# Patient Record
Sex: Male | Born: 1970 | Hispanic: No | Smoking: Current every day smoker
Health system: Southern US, Community
[De-identification: ages and names within clinical notes are randomized; demographics above are authoritative.]

## PROBLEM LIST (undated history)

## (undated) DIAGNOSIS — K219 Gastro-esophageal reflux disease without esophagitis: Secondary | ICD-10-CM

## (undated) DIAGNOSIS — E119 Type 2 diabetes mellitus without complications: Secondary | ICD-10-CM

## (undated) HISTORY — DX: Type 2 diabetes mellitus without complications: E11.9

## (undated) HISTORY — DX: Gastro-esophageal reflux disease without esophagitis: K21.9

---

## 2015-02-04 ENCOUNTER — Ambulatory Visit (INDEPENDENT_AMBULATORY_CARE_PROVIDER_SITE_OTHER): Payer: Self-pay | Admitting: Physician Assistant

## 2015-02-04 VITALS — BP 132/80 | HR 102 | Temp 98.6°F | Resp 16 | Ht 67.0 in | Wt 193.0 lb

## 2015-02-04 DIAGNOSIS — G629 Polyneuropathy, unspecified: Secondary | ICD-10-CM

## 2015-02-04 DIAGNOSIS — E1142 Type 2 diabetes mellitus with diabetic polyneuropathy: Secondary | ICD-10-CM

## 2015-02-04 LAB — COMPREHENSIVE METABOLIC PANEL
ALK PHOS: 71 U/L (ref 40–115)
ALT: 13 U/L (ref 9–46)
AST: 11 U/L (ref 10–40)
Albumin: 4.3 g/dL (ref 3.6–5.1)
BILIRUBIN TOTAL: 0.4 mg/dL (ref 0.2–1.2)
BUN: 12 mg/dL (ref 7–25)
CO2: 26 mmol/L (ref 20–31)
Calcium: 9 mg/dL (ref 8.6–10.3)
Chloride: 97 mmol/L — ABNORMAL LOW (ref 98–110)
Creat: 0.69 mg/dL (ref 0.60–1.35)
GLUCOSE: 343 mg/dL — AB (ref 65–99)
POTASSIUM: 4 mmol/L (ref 3.5–5.3)
Sodium: 132 mmol/L — ABNORMAL LOW (ref 135–146)
Total Protein: 6.8 g/dL (ref 6.1–8.1)

## 2015-02-04 LAB — POC MICROSCOPIC URINALYSIS (UMFC): MUCUS RE: ABSENT

## 2015-02-04 LAB — POCT URINALYSIS DIP (MANUAL ENTRY)
BILIRUBIN UA: NEGATIVE
Bilirubin, UA: NEGATIVE
Blood, UA: NEGATIVE
Leukocytes, UA: NEGATIVE
Nitrite, UA: NEGATIVE
Protein Ur, POC: NEGATIVE
SPEC GRAV UA: 1.015
Urobilinogen, UA: 0.2
pH, UA: 6

## 2015-02-04 LAB — POCT GLYCOSYLATED HEMOGLOBIN (HGB A1C): Hemoglobin A1C: >14

## 2015-02-04 LAB — GLUCOSE, POCT (MANUAL RESULT ENTRY): POC Glucose: 344 mg/dl — AB (ref 70–99)

## 2015-02-04 MED ORDER — GABAPENTIN 100 MG PO CAPS: 100.0000 mg | ORAL_CAPSULE | Freq: Three times a day (TID) | ORAL | Status: DC

## 2015-02-04 MED ORDER — METFORMIN HCL 1000 MG PO TABS: 1000.0000 mg | ORAL_TABLET | Freq: Two times a day (BID) | ORAL | Status: DC

## 2015-02-04 NOTE — Patient Instructions (Signed)
Diabetes Mellitus and Food It is important for you to manage your blood sugar (glucose) level. Your blood glucose level can be greatly affected by what you eat. Eating healthier foods in the appropriate amounts throughout the day at about the same time each day will help you control your blood glucose level. It can also help slow or prevent worsening of your diabetes mellitus. Healthy eating may even help you improve the level of your blood pressure and reach or maintain a healthy weight.  General recommendations for healthful eating and cooking habits include:  Eating meals and snacks regularly. Avoid going long periods of time without eating to lose weight.  Eating a diet that consists mainly of plant-based foods, such as fruits, vegetables, nuts, legumes, and whole grains.  Using low-heat cooking methods, such as baking, instead of high-heat cooking methods, such as deep frying. Work with your dietitian to make sure you understand how to use the Nutrition Facts information on food labels. HOW CAN FOOD AFFECT ME? Carbohydrates Carbohydrates affect your blood glucose level more than any other type of food. Your dietitian will help you determine how many carbohydrates to eat at each meal and teach you how to count carbohydrates. Counting carbohydrates is important to keep your blood glucose at a healthy level, especially if you are using insulin or taking certain medicines for diabetes mellitus. Alcohol Alcohol can cause sudden decreases in blood glucose (hypoglycemia), especially if you use insulin or take certain medicines for diabetes mellitus. Hypoglycemia can be a life-threatening condition. Symptoms of hypoglycemia (sleepiness, dizziness, and disorientation) are similar to symptoms of having too much alcohol.  If your health care provider has given you approval to drink alcohol, do so in moderation and use the following guidelines:  Women should not have more than one drink per day, and men  should not have more than two drinks per day. One drink is equal to:  12 oz of beer.  5 oz of wine.  1 oz of hard liquor.  Do not drink on an empty stomach.  Keep yourself hydrated. Have water, diet soda, or unsweetened iced tea.  Regular soda, juice, and other mixers might contain a lot of carbohydrates and should be counted. WHAT FOODS ARE NOT RECOMMENDED? As you make food choices, it is important to remember that all foods are not the same. Some foods have fewer nutrients per serving than other foods, even though they might have the same number of calories or carbohydrates. It is difficult to get your body what it needs when you eat foods with fewer nutrients. Examples of foods that you should avoid that are high in calories and carbohydrates but low in nutrients include:  Trans fats (most processed foods list trans fats on the Nutrition Facts label).  Regular soda.  Juice.  Candy.  Sweets, such as cake, pie, doughnuts, and cookies.  Fried foods. WHAT FOODS CAN I EAT? Eat nutrient-rich foods, which will nourish your body and keep you healthy. The food you should eat also will depend on several factors, including:  The calories you need.  The medicines you take.  Your weight.  Your blood glucose level.  Your blood pressure level.  Your cholesterol level. You should eat a variety of foods, including:  Protein.  Lean cuts of meat.  Proteins low in saturated fats, such as fish, egg whites, and beans. Avoid processed meats.  Fruits and vegetables.  Fruits and vegetables that may help control blood glucose levels, such as apples, mangoes, and   yams.  Dairy products.  Choose fat-free or low-fat dairy products, such as milk, yogurt, and cheese.  Grains, bread, pasta, and rice.  Choose whole grain products, such as multigrain bread, whole oats, and brown rice. These foods may help control blood pressure.  Fats.  Foods containing healthful fats, such as nuts,  avocado, olive oil, canola oil, and fish. DOES EVERYONE WITH DIABETES MELLITUS HAVE THE SAME MEAL PLAN? Because every person with diabetes mellitus is different, there is not one meal plan that works for everyone. It is very important that you meet with a dietitian who will help you create a meal plan that is just right for you.   This information is not intended to replace advice given to you by your health care provider. Make sure you discuss any questions you have with your health care provider.   Document Released: 01/13/2005 Document Revised: 05/09/2014 Document Reviewed: 03/15/2013 Elsevier Interactive Patient Education 2016 [employer]  

## 2015-02-04 NOTE — Progress Notes (Signed)
Subjective:    Patient ID: Kirk Foley, male    DOB: December 10, 1970, 44 y.o.   MRN: [id number]  HPI Patient presents for bilateral burning/tingling foot pain that has been getting progressively worse over the past 3-4 months. Pain mostly felt when he is walking or standing and only has sensation on the bottom of his feet. Pain does not radiate. Denies numbness or weakness. No fever or trauma noted. H/o prediabetes that was treated for 3 times "a long time ago" and discontinued medication bc he didn't think he needed when he decreased the amount of meat he was eating. Denies dyslipidemia and HTN. Not taking following any particular diet and does not exercise. 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 NKDA.    Review of Systems  Constitutional: Negative for fever, chills and fatigue.  Respiratory: Negative for shortness of breath.   Cardiovascular: Negative for chest pain, palpitations and leg swelling.  Gastrointestinal: Negative for nausea.  Endocrine: Negative for polydipsia, polyphagia and polyuria.  Neurological: Negative for dizziness, weakness, numbness and headaches.       Objective:   Physical Exam  Constitutional: He is oriented to person, place, and time. He appears well-developed and well-nourished. No distress.  Blood pressure 132/80, pulse 102, temperature 98.6 F (37 C), temperature source Oral, resp. rate 16, height  (1.702 m), weight 193 lb (87.544 kg), SpO2 98 %.   HENT:  Head: Normocephalic and atraumatic.  Right Ear: External ear normal.  Left Ear: External ear normal.  Eyes: Conjunctivae are normal. Pupils are equal, round, and reactive to light. Right eye exhibits no discharge. Left eye exhibits no discharge. No scleral icterus.  Neck: Normal range of motion. Neck supple. No thyromegaly present.  Cardiovascular: Normal rate, regular rhythm, normal heart sounds and intact distal pulses.  Exam reveals no gallop and no friction  rub.   No murmur heard. Pulmonary/Chest: Effort normal and breath sounds normal. No respiratory distress. He has no wheezes. He has no rales.  Musculoskeletal: Normal range of motion. He exhibits no edema or tenderness.  Lymphadenopathy:    He has no cervical adenopathy.  Neurological: He is alert and oriented to person, place, and time. He has normal strength and normal reflexes. He displays no atrophy. No cranial nerve deficit or sensory deficit. He exhibits normal muscle tone. Coordination normal.  2 point discrimination intact  Skin: Skin is warm and dry. No rash noted. He is not diaphoretic. No erythema. No pallor.  Psychiatric: He has a normal mood and affect. His behavior is normal. Judgment and thought content normal.     Results for orders placed or performed in visit on 02/04/15  POCT glucose (manual entry)  Result Value Ref Range   POC Glucose 344 (A) 70 - 99 mg/dl  POCT glycosylated hemoglobin (Hb A1C)  Result Value Ref Range   Hemoglobin A1C >14   POCT urinalysis dipstick  Result Value Ref Range   Color, UA yellow yellow   Clarity, UA clear clear   Glucose, UA >=1,000 (A) negative   Bilirubin, UA negative negative   Ketones, POC UA negative negative   Spec Grav, UA 1.015    Blood, UA negative negative   pH, UA 6.0    Protein Ur, POC negative negative   Urobilinogen, UA 0.2    Nitrite, UA Negative Negative   Leukocytes, UA Negative Negative  POCT Microscopic Urinalysis ([facility])  Result Value Ref Range   WBC,UR,HPF,POC  Few (A) None WBC/hpf   RBC,UR,HPF,POC Few (A) None RBC/hpf   Bacteria Few (A) None   Mucus Absent Absent   Epithelial Cells, UR Per Microscopy Few (A) None cells/hpf       Assessment & Plan:  1. Type 2 diabetes mellitus with diabetic polyneuropathy, without long-term current use of insulin (HCC) Discussed diet and exercise at length. Will rtc in 2 weeks and will start second medication at that time. Will re-check glucose at that time as well. -  POCT urinalysis dipstick - POCT Microscopic Urinalysis ([facility]) - Comprehensive metabolic panel - metFORMIN (GLUCOPHAGE) 1000 MG tablet; Take 1 tablet (1,000 mg total) by mouth 2 (two) times daily with a meal.  Dispense: 180 tablet; Refill: 0  2. Neuropathy (HCC) - POCT glucose (manual entry) - POCT glycosylated hemoglobin (Hb A1C) - gabapentin (NEURONTIN) 100 MG capsule; Take 1 capsule (100 mg total) by mouth 3 (three) times daily.  Dispense: 90 capsule; Refill: 3   Kirk Blanton PA-C  [facility] 02/05/2015 8:59 AM

## 2015-02-05 ENCOUNTER — Encounter: Payer: Self-pay | Admitting: Physician Assistant

## 2015-02-05 ENCOUNTER — Telehealth: Payer: Self-pay | Admitting: Physician Assistant

## 2015-02-05 DIAGNOSIS — G629 Polyneuropathy, unspecified: Secondary | ICD-10-CM | POA: Insufficient documentation

## 2015-02-05 DIAGNOSIS — E1142 Type 2 diabetes mellitus with diabetic polyneuropathy: Secondary | ICD-10-CM | POA: Insufficient documentation

## 2015-02-05 NOTE — Telephone Encounter (Signed)
Relayed labs. States doing well with metformin without any side effects. Foot pain better as well and took gabapentin last night. Will rtc for f/u in 2 weeks. States that he feels much better than he did previous.

## 2015-02-18 ENCOUNTER — Ambulatory Visit (INDEPENDENT_AMBULATORY_CARE_PROVIDER_SITE_OTHER): Payer: Self-pay | Admitting: Urgent Care

## 2015-02-18 VITALS — BP 144/80 | HR 98 | Temp 98.7°F | Resp 16 | Ht 68.0 in | Wt 196.0 lb

## 2015-02-18 DIAGNOSIS — IMO0001 Reserved for inherently not codable concepts without codable children: Secondary | ICD-10-CM

## 2015-02-18 DIAGNOSIS — G629 Polyneuropathy, unspecified: Secondary | ICD-10-CM

## 2015-02-18 DIAGNOSIS — R030 Elevated blood-pressure reading, without diagnosis of hypertension: Secondary | ICD-10-CM

## 2015-02-18 DIAGNOSIS — E119 Type 2 diabetes mellitus without complications: Secondary | ICD-10-CM | POA: Insufficient documentation

## 2015-02-18 LAB — HEMOGLOBIN A1C: Hgb A1c MFr Bld: 12.1 % — AB (ref 4.0–6.0)

## 2015-02-18 LAB — POCT GLYCOSYLATED HEMOGLOBIN (HGB A1C): Hemoglobin A1C: 12.1

## 2015-02-18 LAB — GLUCOSE, POCT (MANUAL RESULT ENTRY): POC GLUCOSE: 155 mg/dL — AB (ref 70–99)

## 2015-02-18 NOTE — Progress Notes (Signed)
    MRN: [id number] DOB: 10/29/1970  Subjective:   Kirk Foley is a 44 y.o. male presenting for chief complaint of Follow-up and OTHER  Reports new diagnosis of diabetes on 02/04/2015. He has since started Metformin, also started Gabapentin for diabetic neuropathy. He has not noticed a difference in his neuropathy with gabapentin, is taking 100mg  once daily. Denies chest pain, shob, n/v, abdominal pain, hematuria, headache, double vision, blurred vision. Patient is working on quitting smoking. Currently smoking 1 ppd every 2 days down from 1 pack every day. Denies any other aggravating or relieving factors, no other questions or concerns.  Kirk Foley has a current medication list which includes the following prescription(s): gabapentin and metformin. Also has No Known Allergies.  Kirk Foley  has a past medical history of GERD (gastroesophageal reflux disease) and Diabetes mellitus without complication (HCC). Also  has no past surgical history on file.  Objective:   Vitals: BP 144/80 mmHg  Pulse 98  Temp(Src) 98.7 F (37.1 C) (Oral)  Resp 16  Ht 5\' 8"  (1.727 m)  Wt 196 lb (88.905 kg)  BMI 29.81 kg/m2  SpO2 98%  BP Readings from Last 3 Encounters:  02/18/15 144/80  02/04/15 132/80   Physical Exam  Constitutional: He is oriented to person, place, and time. He appears well-developed and well-nourished.  HENT:  Mouth/Throat: Oropharynx is clear and moist.  Eyes: Right eye exhibits no discharge. Left eye exhibits no discharge. No scleral icterus.  Cardiovascular: Normal rate, regular rhythm and intact distal pulses.  Exam reveals no gallop and no friction rub.   No murmur heard. Pulmonary/Chest: No respiratory distress. He has no wheezes. He has no rales.  Abdominal: Soft. Bowel sounds are normal. He exhibits no distension and no mass. There is no tenderness.  Musculoskeletal: He exhibits no edema or tenderness.  Neurological: He is alert and oriented to person, place, and time.  Skin:  Skin is warm and dry. No rash noted. No erythema. No pallor.  Patient has multiple resolving skin boils over his upper back.   Results for orders placed or performed in visit on 02/18/15 (from the past 24 hour(s))  POCT glucose (manual entry)     Status: Abnormal   Collection Time: 02/18/15  5:56 PM  Result Value Ref Range   POC Glucose 155 (A) 70 - 99 mg/dl  POCT glycosylated hemoglobin (Hb A1C)     Status: None   Collection Time: 02/18/15  5:56 PM  Result Value Ref Range   Hemoglobin A1C 12.1    Assessment and Plan :   1. New onset type 2 diabetes mellitus (HCC) - Patient refused medical care. States that he only came to get his blood sugar checked again. He refuses adding additional medications that I recommended today including glipizide and lisinopril. I counseled patient on risks of uncontrolled diabetes, importance of managing this, patient verbalized understanding and refused medical care.  2. Neuropathy (HCC) - Recommended patient increase his Gabapentin to 300mg  2-3 times daily for his diabetic neuropathy.  3. Elevated blood pressure - Patient refused lisinopril.  Wallis BambergMario Season Astacio, PA-C [facility] [phone] 02/18/2015 5:20 PM

## 2015-02-18 NOTE — Patient Instructions (Signed)
Type 2 Diabetes Mellitus, Adult Type 2 diabetes mellitus, often simply referred to as type 2 diabetes, is a long-lasting (chronic) disease. In type 2 diabetes, the pancreas does not make enough insulin (a hormone), the cells are less responsive to the insulin that is made (insulin resistance), or both. Normally, insulin moves sugars from food into the tissue cells. The tissue cells use the sugars for energy. The lack of insulin or the lack of normal response to insulin causes excess sugars to build up in the blood instead of going into the tissue cells. As a result, high blood sugar (hyperglycemia) develops. The effect of high sugar (glucose) levels can cause many complications. Type 2 diabetes was also previously called adult-onset diabetes, but it can occur at any age.  RISK FACTORS  A person is predisposed to developing type 2 diabetes if someone in the family has the disease and also has one or more of the following primary risk factors:  Weight gain, or being overweight or obese.  An inactive lifestyle.  A history of consistently eating high-calorie foods. Maintaining a normal weight and regular physical activity can reduce the chance of developing type 2 diabetes. SYMPTOMS  A person with type 2 diabetes may not show symptoms initially. The symptoms of type 2 diabetes appear slowly. The symptoms include:  Increased thirst (polydipsia).  Increased urination (polyuria).  Increased urination during the night (nocturia).  Sudden or unexplained weight changes.  Frequent, recurring infections.  Tiredness (fatigue).  Weakness.  Vision changes, such as blurred vision.  Fruity smell to your breath.  Abdominal pain.  Nausea or vomiting.  Cuts or bruises which are slow to heal.  Tingling or numbness in the hands or feet.  An open skin wound (ulcer). DIAGNOSIS Type 2 diabetes is frequently not diagnosed until complications of diabetes are present. Type 2 diabetes is diagnosed  when symptoms or complications are present and when blood glucose levels are increased. Your blood glucose level may be checked by one or more of the following blood tests:  A fasting blood glucose test. You will not be allowed to eat for at least 8 hours before a blood sample is taken.  A random blood glucose test. Your blood glucose is checked at any time of the day regardless of when you ate.  A hemoglobin A1c blood glucose test. A hemoglobin A1c test provides information about blood glucose control over the previous 3 months.  An oral glucose tolerance test (OGTT). Your blood glucose is measured after you have not eaten (fasted) for 2 hours and then after you drink a glucose-containing beverage. TREATMENT   You may need to take insulin or diabetes medicine daily to keep blood glucose levels in the desired range.  If you use insulin, you may need to adjust the dosage depending on the carbohydrates that you eat with each meal or snack.  Lifestyle changes are recommended as part of your treatment. These may include:  Following an individualized diet plan developed by a nutritionist or dietitian.  Exercising daily. Your health care providers will set individualized treatment goals for you based on your age, your medicines, how long you have had diabetes, and any other medical conditions you have. Generally, the goal of treatment is to maintain the following blood glucose levels:  Before meals (preprandial): 80-130 mg/dL.  After meals (postprandial): below 180 mg/dL.  A1c: less than 6.5-7%. HOME CARE INSTRUCTIONS   Have your hemoglobin A1c level checked twice a year.  Perform daily blood glucose monitoring  as directed by your health care provider.  Monitor urine ketones when you are ill and as directed by your health care provider.  Take your diabetes medicine or insulin as directed by your health care provider to maintain your blood glucose levels in the desired range.  Never run  out of diabetes medicine or insulin. It is needed every day.  If you are using insulin, you may need to adjust the amount of insulin given based on your intake of carbohydrates. Carbohydrates can raise blood glucose levels but need to be included in your diet. Carbohydrates provide vitamins, minerals, and fiber which are an essential part of a healthy diet. Carbohydrates are found in fruits, vegetables, whole grains, dairy products, legumes, and foods containing added sugars.  Eat healthy foods. You should make an appointment to see a registered dietitian to help you create an eating plan that is right for you.  Lose weight if you are overweight.  Carry a medical alert card or wear your medical alert jewelry.  Carry a 15-gram carbohydrate snack with you at all times to treat low blood glucose (hypoglycemia). Some examples of 15-gram carbohydrate snacks include:  Glucose tablets, 3 or 4.  Glucose gel, 15-gram tube.  Raisins, 2 tablespoons (24 grams).  Jelly beans, 6.  Animal crackers, 8.  Regular pop, 4 ounces (120 mL).  Gummy treats, 9.  Recognize hypoglycemia. Hypoglycemia occurs with blood glucose levels of 70 mg/dL and below. The risk for hypoglycemia increases when fasting or skipping meals, during or after intense exercise, and during sleep. Hypoglycemia symptoms can include:  Tremors or shakes.  Decreased ability to concentrate.  Sweating.  Increased heart rate.  Headache.  Dry mouth.  Hunger.  Irritability.  Anxiety.  Restless sleep.  Altered speech or coordination.  Confusion.  Treat hypoglycemia promptly. If you are alert and able to safely swallow, follow the 15:15 rule:  Take 15-20 grams of rapid-acting glucose or carbohydrate. Rapid-acting options include glucose gel, glucose tablets, or 4 ounces (120 mL) of fruit juice, regular soda, or low-fat milk.  Check your blood glucose level 15 minutes after taking the glucose.  Take 15-20 grams more of  glucose if the repeat blood glucose level is still 70 mg/dL or below.  Eat a meal or snack within 1 hour once blood glucose levels return to normal.  Be alert to feeling very thirsty and urinating more frequently than usual, which are early signs of hyperglycemia. An early awareness of hyperglycemia allows for prompt treatment. Treat hyperglycemia as directed by your health care provider.  Engage in at least 150 minutes of moderate-intensity physical activity a week, spread over at least 3 days of the week or as directed by your health care provider. In addition, you should engage in resistance exercise at least 2 times a week or as directed by your health care provider. Try to spend no more than 90 minutes at one time inactive.  Adjust your medicine and food intake as needed if you start a new exercise or sport.  Follow your sick-day plan anytime you are unable to eat or drink as usual.  Do not use any tobacco products including cigarettes, chewing tobacco, or electronic cigarettes. If you need help quitting, ask your health care provider.  Limit alcohol intake to no more than 1 drink per day for nonpregnant women and 2 drinks per day for men. You should drink alcohol only when you are also eating food. Talk with your health care provider whether alcohol is safe   for you. Tell your health care provider if you drink alcohol several times a week.  Keep all follow-up visits as directed by your health care provider. This is important.  Schedule an eye exam soon after the diagnosis of type 2 diabetes and then annually.  Perform daily skin and foot care. Examine your skin and feet daily for cuts, bruises, redness, nail problems, bleeding, blisters, or sores. A foot exam by a health care provider should be done annually.  Brush your teeth and gums at least twice a day and floss at least once a day. Follow up with your dentist regularly.  Share your diabetes management plan with your workplace or  school.  Keep your immunizations up to date. It is recommended that you receive a flu (influenza) vaccine every year. It is also recommended that you receive a pneumonia (pneumococcal) vaccine. If you are 41 years of age or older and have never received a pneumonia vaccine, this vaccine may be given as a series of two separate shots. Ask your health care provider which additional vaccines may be recommended.  Learn to manage stress.  Obtain ongoing diabetes education and support as needed.  Participate in or seek rehabilitation as needed to maintain or improve independence and quality of life. Request a physical or occupational therapy referral if you are having foot or hand numbness, or difficulties with grooming, dressing, eating, or physical activity. SEEK MEDICAL CARE IF:   You are unable to eat food or drink fluids for more than 6 hours.  You have nausea and vomiting for more than 6 hours.  Your blood glucose level is over 240 mg/dL.  There is a change in mental status.  You develop an additional serious illness.  You have diarrhea for more than 6 hours.  You have been sick or have had a fever for a couple of days and are not getting better.  You have pain during any physical activity.  SEEK IMMEDIATE MEDICAL CARE IF:  You have difficulty breathing.  You have moderate to large ketone levels.   This information is not intended to replace advice given to you by your health care provider. Make sure you discuss any questions you have with your health care provider.   Document Released: 04/18/2005 Document Revised: 01/07/2015 Document Reviewed: 11/15/2011 Elsevier Interactive Patient Education 2016 [employer].   Diabetic Neuropathy Diabetic neuropathy is a nerve disease or nerve damage that is caused by diabetes mellitus. About half of all people with diabetes mellitus have some form of nerve damage. Nerve damage is more common in those who have had diabetes mellitus for  many years and who generally have not had good control of their blood sugar (glucose) level. Diabetic neuropathy is a common complication of diabetes mellitus. There are three common types of diabetic neuropathy and a fourth type that is less common and less understood:   Peripheral neuropathy--This is the most common type of diabetic neuropathy. It causes damage to the nerves of the feet and legs first and then eventually the hands and arms.The damage affects the ability to sense touch.  Autonomic neuropathy--This type causes damage to the autonomic nervous system, which controls the following functions:  Heartbeat.  Body temperature.  Blood pressure.  Urination.  Digestion.  Sweating.  Sexual function.  Focal neuropathy--Focal neuropathy can be painful and unpredictable and occurs most often in older adults with diabetes mellitus. It involves a specific nerve or one area and often comes on suddenly. It usually does not cause  long-term problems.  Radiculoplexus neuropathy-- Sometimes called lumbosacral radiculoplexus neuropathy, radiculoplexus neuropathy affects the nerves of the thighs, hips, buttocks, or legs. It is more common in people with type 2 diabetes mellitus and in older men. It is characterized by debilitating pain, weakness, and atrophy, usually in the thigh muscles. CAUSES  The cause of peripheral, autonomic, and focal neuropathies is diabetes mellitus that is uncontrolled and high glucose levels. The cause of radiculoplexus neuropathy is unknown. However, it is thought to be caused by inflammation related to uncontrolled glucose levels. SIGNS AND SYMPTOMS  Peripheral Neuropathy Peripheral neuropathy develops slowly over time. When the nerves of the feet and legs no longer work there may be:   Burning, stabbing, or aching pain in the legs or feet.  Inability to feel pressure or pain in your feet. This can lead to:  Thick calluses over pressure areas.  Pressure  sores.  Ulcers.  Foot deformities.  Reduced ability to feel temperature changes.  Muscle weakness. Autonomic Neuropathy The symptoms of autonomic neuropathy vary depending on which nerves are affected. Symptoms may include:  Problems with digestion, such as:  Feeling sick to your stomach (nausea).  Vomiting.  Bloating.  Constipation.  Diarrhea.  Abdominal pain.  Difficulty with urination. This occurs if you lose your ability to sense when your bladder is full. Problems include:  Urine leakage (incontinence).  Inability to empty your bladder completely (retention).  Rapid or irregular heartbeat (palpitations).  Blood pressure drops when you stand up (orthostatic hypotension). When you stand up you may feel:  Dizzy.  Weak.  Faint.  In men, inability to attain and maintain an erection.  In women, vaginal dryness and problems with decreased sexual desire and arousal.  Problems with body temperature regulation.  Increased or decreased sweating. Focal Neuropathy  Abnormal eye movements or abnormal alignment of both eyes.  Weakness in the wrist.  Foot drop. This results in an inability to lift the foot properly and abnormal walking or foot movement.  Paralysis on one side of your face (Bell palsy).  Chest or abdominal pain. Radiculoplexus Neuropathy  Sudden, severe pain in your hip, thigh, or buttocks.  Weakness and wasting of thigh muscles.  Difficulty rising from a seated position.  Abdominal swelling.  Unexplained weight loss (usually more than 10 lb [4.5 kg]). DIAGNOSIS  Peripheral Neuropathy Your senses may be tested. Sensory function testing can be done with:  A light touch using a monofilament.  A vibration with tuning fork.  A sharp sensation with a pin prick. Other tests that can help diagnose neuropathy are:  Nerve conduction velocity. This test checks the transmission of an electrical current through a nerve.  Electromyography.  This shows how muscles respond to electrical signals transmitted by nearby nerves.  Quantitative sensory testing. This is used to assess how your nerves respond to vibrations and changes in temperature. Autonomic Neuropathy Diagnosis is often based on reported symptoms. Tell your health care provider if you experience:   Dizziness.   Constipation.   Diarrhea.   Inappropriate urination or inability to urinate.   Inability to get or maintain an erection.  Tests that may be done include:   Electrocardiography or Holter monitor. These are tests that can help show problems with the heart rate or heart rhythm.   An X-ray exam may be done. Focal Neuropathy Diagnosis is made based on your symptoms and what your health care provider finds during your exam. Other tests may be done. They may include:  Nerve conduction  velocities. This checks the transmission of electrical current through a nerve.  Electromyography. This shows how muscles respond to electrical signals transmitted by nearby nerves.  Quantitative sensory testing. This test is used to assess how your nerves respond to vibration and changes in temperature. Radiculoplexus Neuropathy  Often the first thing is to eliminate any other issue or problems that might be the cause, as there is no stick test for diagnosis.  X-ray exam of your spine and lumbar region.  Spinal tap to rule out cancer.  MRI to rule out other lesions. TREATMENT  Once nerve damage occurs, it cannot be reversed. The goal of treatment is to keep the disease or nerve damage from getting worse and affecting more nerve fibers. Controlling your blood glucose level is the key. Most people with radiculoplexus neuropathy see at least a partial improvement over time. You will need to keep your blood glucose and HbA1c levels in the target range determined by your health care provider. Things that help control blood glucose levels include:   Blood glucose  monitoring.   Meal planning.   Physical activity.   Diabetes medicine.  Over time, maintaining lower blood glucose levels helps lessen symptoms. Sometimes, prescription pain medicine is needed. HOME CARE INSTRUCTIONS:  Do not smoke.  Keep your blood glucose level in the range that you and your health care provider have determined acceptable for you.  Keep your blood pressure level in the range that you and your health care provider have determined acceptable for you.  Eat a well-balanced diet.  Be physically active every day. Include strength training and balance exercises.  Protect your feet.  Check your feet every day for sores, cuts, blisters, or signs of infection.  Wear padded socks and supportive shoes. Use orthotic inserts, if necessary.  Regularly check the insides of your shoes for worn spots. Make sure there are no rocks or other items inside your shoes before you put them on. SEEK MEDICAL CARE IF:   You have burning, stabbing, or aching pain in the legs or feet.  You are unable to feel pressure or pain in your feet.  You develop problems with digestion such as:  Nausea.  Vomiting.  Bloating.  Constipation.  Diarrhea.  Abdominal pain.  You have difficulty with urination, such as:  Incontinence.  Retention.  You have palpitations.  You develop orthostatic hypotension. When you stand up you may feel:  Dizzy.  Weak.  Faint.  You cannot attain and maintain an erection (in men).  You have vaginal dryness and problems with decreased sexual desire and arousal (in women).  You have severe pain in your thighs, legs, or buttocks.  You have unexplained weight loss.   This information is not intended to replace advice given to you by your health care provider. Make sure you discuss any questions you have with your health care provider.   Document Released: 06/27/2001 Document Revised: 05/09/2014 Document Reviewed: 09/27/2012 Elsevier  Interactive Patient Education [employer]

## 2015-02-27 ENCOUNTER — Encounter: Payer: Self-pay | Admitting: Family Medicine

## 2015-03-02 ENCOUNTER — Encounter: Payer: Self-pay | Admitting: Family Medicine

## 2015-08-25 ENCOUNTER — Other Ambulatory Visit: Payer: Self-pay | Admitting: Urgent Care

## 2015-08-25 ENCOUNTER — Other Ambulatory Visit: Payer: Self-pay

## 2015-08-25 DIAGNOSIS — G629 Polyneuropathy, unspecified: Secondary | ICD-10-CM

## 2015-08-25 MED ORDER — GABAPENTIN 100 MG PO CAPS: 100.0000 mg | ORAL_CAPSULE | Freq: Three times a day (TID) | ORAL | Status: DC

## 2016-09-29 ENCOUNTER — Ambulatory Visit (INDEPENDENT_AMBULATORY_CARE_PROVIDER_SITE_OTHER): Payer: Self-pay | Admitting: Family Medicine

## 2016-09-29 ENCOUNTER — Encounter: Payer: Self-pay | Admitting: Family Medicine

## 2016-09-29 VITALS — BP 126/79 | HR 87 | Temp 98.6°F | Resp 18 | Ht 67.52 in | Wt 187.4 lb

## 2016-09-29 DIAGNOSIS — E119 Type 2 diabetes mellitus without complications: Secondary | ICD-10-CM

## 2016-09-29 DIAGNOSIS — M7661 Achilles tendinitis, right leg: Secondary | ICD-10-CM | POA: Insufficient documentation

## 2016-09-29 DIAGNOSIS — M7662 Achilles tendinitis, left leg: Secondary | ICD-10-CM

## 2016-09-29 MED ORDER — METFORMIN HCL 500 MG PO TABS: 500.0000 mg | ORAL_TABLET | Freq: Two times a day (BID) | ORAL | 3 refills | Status: AC

## 2016-09-29 MED ORDER — DICLOFENAC SODIUM 75 MG PO TBEC: 75.0000 mg | DELAYED_RELEASE_TABLET | Freq: Two times a day (BID) | ORAL | 0 refills | Status: AC

## 2016-09-29 NOTE — Patient Instructions (Addendum)
[email or web address]://choosehealth.[place].gov/documents/pdfs/multicultural/arabic.pdf  Go to the website diabetes.org and look up type 2 diabetes and read as much about it as you can.  Taking care of your diabetes is very important. Read the handout that I gave you very carefully. I realize you do not have insurance and health care is expensive, but it would be a good idea for you to see a doctor a couple of times a year to get some laboratory tests done and monitor your diabetes.  Continue to avoid eating a lot of things that have sugar or are sweet.  Avoid too much bread and pasta and rice and potatoes and grain.  It is better to eat things like beans and fruits and vegetables and lean meats. I realize this is difficult with the [place] diet.   Check your blood sugars a couple of times a week and write them down. The best time to check them is in the morning before you have had anything to eat. If you are running many blood sugars above 125 when you are fasting and have not had anything to eat for 6 or 8 hours, then I think you should come see a doctor to get started on some medicine.    You have Achilles tendinitis. That is why your heels hurt.  Apply ice for about 15 minutes 2-4 times each day  Take diclofenac 75 mg one twice daily for pain and inflammation  Do gentle stretches of the heel several times a day. However also you should avoid jumping or running hard.  If you continue to have a lot of pain we may need to refer you to a sports medicine specialist who deal with a lot of this kind of problem or to a physical therapist.      IF you received an x-ray today, you will receive an invoice from [facility]. Please contact [facility] at [phone] with questions or concerns regarding your invoice.   IF you received labwork today, you will receive an invoice from [facility]. Please contact [facility] at [phone] with questions or concerns regarding your invoice.   Our billing  staff will not be able to assist you with questions regarding bills from these companies.  You will be contacted with the lab results as soon as they are available. The fastest way to get your results is to activate your My Chart account. Instructions are located on the last page of this paperwork. If you have not heard from us regarding the results in 2 weeks, please contact this office.

## 2016-09-29 NOTE — Progress Notes (Signed)
Patient ID: Kirk DrownRachid Foley, male    DOB: 07/07/1970  Age: 46 y.o. MRN: [id number]  Chief Complaint  Patient presents with  . Ankle Pain    both ankles down to the foot x 1 week     Subjective:   Patient is here because he has been hurting for the last couple of weeks of both heels. No known injury. He stands for a long time at work, and that makes him hurt terribly. He has had symptoms which sound like plantar fasciitis in the past, but that is not what is bothering him now.  He is a diabetic and is not on any medication. 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 Now he is more active on his feet all day, but does not do a lot of physical exercise other than the prolonged standing.  He is from [place], speaks good English, does not have insurance.  Current allergies, medications, problem list, past/family and social histories reviewed.  Objective:  BP 126/79   Pulse 87   Temp 98.6 F (37 C) (Oral)   Resp 18   Ht 5' 7.52" (1.715 m)   Wt 187 lb 6.4 oz (85 kg)   SpO2 98%   BMI 28.90 kg/m   Pleasant gentleman alert and oriented. TMs normal. Throat clear. Neck supple without nodes or thyromegaly. Chest clear. Heart regular without murmurs. Abdomen unremarkable. Sensory of his toes with filament was normal. Ankles show very tender but not red or hot Achilles tendons bilaterally.   Assessment & Plan:   Assessment: 1. Achilles tendonitis, bilateral   2. Type 2 diabetes mellitus without complication, without long-term current use of insulin (HCC)       Plan: See instructions. Long discussion.  I gave him a sample free glucose meter and we tested it in the office he was 140 today. He is to keep a record of his sugars and bring them back in with him.   Patient Instructions   AdvertisingReporter.co.nzHttp://choosehealth.[place].gov/documents/pdfs/multicultural/arabic.pdf  Go to the website diabetes.org and look up type 2  diabetes and read as much about it as you can.  Taking care of your diabetes is very important. Read the handout that I gave you very carefully. I realize you do not have insurance and health care is expensive, but it would be a good idea for you to see a doctor a couple of times a year to get some laboratory tests done and monitor your diabetes.  Continue to avoid eating a lot of things that have sugar or are sweet.  Avoid too much bread and pasta and rice and potatoes and grain.  It is better to eat things like beans and fruits and vegetables and lean meats. I realize this is difficult with the [place] diet.   Check your blood sugars a couple of times a week and write them down. The best time to check them is in the morning before you have had anything to eat. If you are running many blood sugars above 125 when you are fasting and have not had anything to eat for 6 or 8 hours, then I think you should come see a doctor to get started on some medicine.    You have Achilles tendinitis. That is why your heels hurt.  Apply ice for about 15 minutes 2-4 times each day  Take diclofenac 75 mg one twice daily for pain and inflammation  Do gentle stretches of the heel several times a day. However also you should avoid jumping or running hard.  If you continue to have a lot of pain we may need to refer you to a sports medicine specialist who deal with a lot of this kind of problem or to a physical therapist.      IF you received an x-ray today, you will receive an invoice from [facility]. Please contact [facility] at [phone] with questions or concerns regarding your invoice.   IF you received labwork today, you will receive an invoice from [facility]. Please contact [facility] at [phone] with questions or concerns regarding your invoice.   Our billing staff will not be able to assist you with questions regarding bills from these companies.  You will be contacted with  the lab results as soon as they are available. The fastest way to get your results is to activate your My Chart account. Instructions are located on the last page of this paperwork. If you have not heard from [place] regarding the results in 2 weeks, please contact this office.         Return in about 3 months (around 12/30/2016) for Check on diabetes.  Bring sugar records.Marland Kitchen   Kirk Kleinman, MD 09/29/2016

## 2017-10-05 ENCOUNTER — Other Ambulatory Visit: Payer: Self-pay | Admitting: Family Medicine

## 2017-10-05 DIAGNOSIS — E119 Type 2 diabetes mellitus without complications: Secondary | ICD-10-CM

## 2017-10-06 NOTE — Telephone Encounter (Signed)
Metformin 500 mg refill request  Former pt of Dr. Alwyn RenHopper.   Not been seen since he left.  Needs appt   [facility] [id number] [place], [place] - [id number]

## 2020-03-29 ENCOUNTER — Encounter (HOSPITAL_COMMUNITY): Payer: Self-pay | Admitting: Emergency Medicine

## 2020-03-29 ENCOUNTER — Other Ambulatory Visit: Payer: Self-pay

## 2020-03-29 ENCOUNTER — Emergency Department (HOSPITAL_COMMUNITY)
Admission: EM | Admit: 2020-03-29 | Discharge: 2020-03-29 | Disposition: A | Discharge status: Home / Self Care | Payer: 59 | Attending: Emergency Medicine | Admitting: Emergency Medicine

## 2020-03-29 ENCOUNTER — Emergency Department (HOSPITAL_COMMUNITY): Payer: 59

## 2020-03-29 DIAGNOSIS — Y92410 Unspecified street and highway as the place of occurrence of the external cause: Secondary | ICD-10-CM | POA: Diagnosis not present

## 2020-03-29 DIAGNOSIS — F17200 Nicotine dependence, unspecified, uncomplicated: Secondary | ICD-10-CM | POA: Insufficient documentation

## 2020-03-29 DIAGNOSIS — E1142 Type 2 diabetes mellitus with diabetic polyneuropathy: Secondary | ICD-10-CM | POA: Diagnosis not present

## 2020-03-29 DIAGNOSIS — R519 Headache, unspecified: Secondary | ICD-10-CM | POA: Insufficient documentation

## 2020-03-29 DIAGNOSIS — M542 Cervicalgia: Secondary | ICD-10-CM | POA: Diagnosis not present

## 2020-03-29 DIAGNOSIS — Z7984 Long term (current) use of oral hypoglycemic drugs: Secondary | ICD-10-CM | POA: Insufficient documentation

## 2020-03-29 MED ORDER — PENICILLIN V POTASSIUM 500 MG PO TABS: 500.0000 mg | ORAL_TABLET | Freq: Four times a day (QID) | ORAL | 0 refills | Status: AC
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
## 2022-03-20 ENCOUNTER — Emergency Department (HOSPITAL_COMMUNITY)
Admission: EM | Admit: 2022-03-20 | Discharge: 2022-03-20 | Disposition: A | Discharge status: Home / Self Care | Payer: 59 | Attending: Emergency Medicine | Admitting: Emergency Medicine

## 2022-03-20 ENCOUNTER — Other Ambulatory Visit: Payer: Self-pay

## 2022-03-20 DIAGNOSIS — Z7984 Long term (current) use of oral hypoglycemic drugs: Secondary | ICD-10-CM | POA: Insufficient documentation

## 2022-03-20 DIAGNOSIS — E1165 Type 2 diabetes mellitus with hyperglycemia: Secondary | ICD-10-CM | POA: Insufficient documentation

## 2022-03-20 DIAGNOSIS — E871 Hypo-osmolality and hyponatremia: Secondary | ICD-10-CM | POA: Diagnosis not present

## 2022-03-20 DIAGNOSIS — L0231 Cutaneous abscess of buttock: Secondary | ICD-10-CM

## 2022-03-20 DIAGNOSIS — D72829 Elevated white blood cell count, unspecified: Secondary | ICD-10-CM | POA: Insufficient documentation

## 2022-03-20 LAB — BASIC METABOLIC PANEL
Anion gap: 11 (ref 5–15)
BUN: 15 mg/dL (ref 6–20)
CO2: 21 mmol/L — ABNORMAL LOW (ref 22–32)
Calcium: 9.3 mg/dL (ref 8.9–10.3)
Chloride: 100 mmol/L (ref 98–111)
Creatinine, Ser: 0.71 mg/dL (ref 0.61–1.24)
GFR, Estimated: >60 mL/min (ref >60)
Glucose, Bld: 287 mg/dL — ABNORMAL HIGH (ref 70–99)
Potassium: 4 mmol/L (ref 3.5–5.1)
Sodium: 132 mmol/L — ABNORMAL LOW (ref 135–145)

## 2022-03-20 LAB — CBC
HCT: 41.5 % (ref 39.0–52.0)
Hemoglobin: 13.5 g/dL (ref 13.0–17.0)
MCH: 32.1 pg (ref 26.0–34.0)
MCHC: 32.5 g/dL (ref 30.0–36.0)
MCV: 98.8 fL (ref 80.0–100.0)
Platelets: 261 10*3/uL (ref 150–400)
RBC: 4.2 MIL/uL — ABNORMAL LOW (ref 4.22–5.81)
RDW: 12.6 % (ref 11.5–15.5)
WBC: 11 10*3/uL — ABNORMAL HIGH (ref 4.0–10.5)
nRBC: 0 % (ref 0.0–0.2)

## 2022-03-20 MED ORDER — LIDOCAINE HCL (PF) 1 % IJ SOLN
10.0000 mL | Freq: Once | INTRAMUSCULAR | Status: AC
  Administered 2022-03-20: 10 mL
  Filled 2022-03-20: qty 30

## 2022-03-20 MED ORDER — BACITRACIN ZINC 500 UNIT/GM EX OINT: TOPICAL_OINTMENT | Freq: Once | CUTANEOUS | Status: AC

## 2022-03-20 MED ORDER — DOXYCYCLINE HYCLATE 100 MG PO CAPS: 100.0000 mg | ORAL_CAPSULE | Freq: Two times a day (BID) | ORAL | 0 refills | Status: AC

## 2022-03-20 NOTE — Discharge Instructions (Addendum)
You have been seen today for your complaint of abscess. Your discharge medications include doxycycline.  This is an antibiotic.  You should take it as prescribed.  You should take it for the entire duration of the prescription. Home care instructions are as follows:  Keep the area clean and dry until symptoms resolve.  You should monitor the area closely to make sure it is not getting larger.  Clean the area with warm soapy water once a day.  You may place topical antibiotic on the area once a day as well.  Do not submerge the area in water until symptoms fully resolved. Follow up with: Your primary care provider in 1 week Please seek immediate medical care if you develop any of the following symptoms: Have severe pain. See red streaks on your skin spreading away from the abscess. See redness that spreads quickly. Have a fever or chills. At this time there does not appear to be the presence of an emergent medical condition, however there is always the potential for conditions to change. Please read and follow the below instructions.  Do not take your medicine if  develop an itchy rash, swelling in your mouth or lips, or difficulty breathing; call 911 and seek immediate emergency medical attention if this occurs.  You may review your lab tests and imaging results in their entirety on your MyChart account.  Please discuss all results of fully with your primary care provider and other specialist at your follow-up visit.  Note: Portions of this text may have been transcribed using voice recognition software. Every effort was made to ensure accuracy; however, inadvertent computerized transcription errors may still be present.   ??? ??? ????? ????? ???? ????? ?? ??????. ???? ????? ?????? ?????????????. ??? ???? ????. ??? ?? ?????? ??? ????? ??????? ????. ??? ???? ?????? ???? ??? ?????? ??????. ??????? ??????? ???????? ?? ??? ???: ???? ??? ??????? ????? ????? ??? ????? ???????. ??? ???? ?????? ???????  ?? ??? ?????? ?? ???? ?? ????. ????? ??????? ?????? ?????? ???????? ??? ????? ?? ?????. ????? ??? ???? ???? ????? ??? ??????? ??? ????? ?????? ?????. ?? ???? ??????? ?? ????? ??? ????? ??????? ??????. ???? ??: ???? ??????? ??????? ????? ?? ???? ????? ???? ???? ??? ??????? ?????? ??????? ??? ???? ???? ?? ?? ??????? ???????:  ????? ?? ??? ????.  ???? ???? ????? ??? ???? ????? ?????? ?? ??????.  ???? ???????? ???? ????? ?????.  ???? ??? ?? ???????. ?? ??? ????? ?? ???? ?? ???? ???? ???? ?????? ???? ???? ?????? ?????? ???? ??????. ???? ????? ?????? ????????? ???????.  ?? ?????? ?????? ??? ??? ???? ??? ???? ???? ?????? ?? ???? ?? ???? ?? ??????? ?? ????? ?? ??????. ???? ?????? 911 ????? ??????? ?????? ??????? ??????? ?? ???? ???? ???.  ????? ?????? ????????? ???????? ?????? ??????? ??????? ??? ???? MyChart ????? ??. ???? ?????? ???? ??????? ???? ???? ?? ???? ??????? ??????? ????? ?? ?????????? ??????? ?? ????? ???????? ?????? ??.  ??????: ???? ?? ??? ????? ?? ??? ???? ???????? ?????? ?????? ??? ?????. ?? ??? ?? ??? ????? ?????? ??? ???? ?? ??? ????? ????? ???????? ??? ???????? ??????.

## 2022-03-20 NOTE — ED Provider Notes (Signed)
[facility] DEPT Provider Note   CSN: [id number] Arrival date & time: 03/20/22  1302     History  Chief Complaint  Patient presents with   Abscess    Kirk Foley is a 51 y.o. male.  With a history of type 2 diabetes who presents ED for evaluation of an abscess to the left buttock.  States he noticed the abscess approximately 1 week ago.  Has had an abscess in the same location approximately 2 years ago.  States it has gotten larger each day.  Painful to touch and to sit.  Denies drainage.  Denies fevers, chills, bowel changes, urinary symptoms.   Abscess      Home Medications Prior to Admission medications   Medication Sig Start Date End Date Taking? Authorizing Provider  doxycycline (VIBRAMYCIN) 100 MG capsule Take 1 capsule (100 mg total) by mouth 2 (two) times daily. 03/20/22  Yes Claressa Hughley, Graft[place] Folk, PA-C  diclofenac (VOLTAREN) 75 MG EC tablet Take 1 tablet (75 mg total) by mouth 2 (two) times daily. 09/29/16   Posey Boyer, MD  metFORMIN (GLUCOPHAGE) 500 MG tablet Take 1 tablet (500 mg total) by mouth 2 (two) times daily with a meal. For diabees. 09/29/16   Posey Boyer, MD      Allergies    Patient has no known allergies.    Review of Systems   Review of Systems  Skin:  Positive for wound.  All other systems reviewed and are negative.   Physical Exam Updated Vital Signs BP (!) 142/88   Pulse 88   Temp 98.4 F (36.9 C) (Oral)   Resp 16   Ht 5\' 7"  (1.702 m)   Wt 85 kg   SpO2 99%   BMI 29.35 kg/m  Physical Exam Vitals and nursing note reviewed.  Constitutional:      General: He is not in acute distress.    Appearance: Normal appearance. He is normal weight. He is not ill-appearing.  HENT:     Head: Normocephalic and atraumatic.  Pulmonary:     Effort: Pulmonary effort is normal. No respiratory distress.  Abdominal:     General: Abdomen is flat.  Musculoskeletal:        General: Normal range of motion.     Cervical  back: Neck supple.  Skin:    General: Skin is warm and dry.     Capillary Refill: Capillary refill takes less than 2 seconds.          Comments: 5 cm x 5 cm area of erythema with central fluctuance to the left gluteus.  No drainage noted.  Neurological:     General: No focal deficit present.     Mental Status: He is alert and oriented to person, place, and time.  Psychiatric:        Mood and Affect: Mood normal.        Behavior: Behavior normal.     ED Results / Procedures / Treatments   Labs (all labs ordered are listed, but only abnormal results are displayed) Labs Reviewed  CBC - Abnormal; Notable for the following components:      Result Value   WBC 11.0 (*)    RBC 4.20 (*)    All other components within normal limits  BASIC METABOLIC PANEL - Abnormal; Notable for the following components:   Sodium 132 (*)    CO2 21 (*)    Glucose, Bld 287 (*)    All other components within normal  limits    EKG None  Radiology No results found.  Procedures .Marland KitchenIncision and Drainage  Date/Time: 03/20/2022 5:45 PM  Performed by: Michelle Piper, PA-C Authorized by: Michelle Piper, PA-C   Consent:    Consent obtained:  Verbal   Consent given by:  Patient   Risks, benefits, and alternatives were discussed: yes     Risks discussed:  Bleeding, incomplete drainage, pain and damage to other organs   Alternatives discussed:  No treatment Universal protocol:    Procedure explained and questions answered to patient or proxy's satisfaction: yes     Relevant documents present and verified: yes     Test results available : yes     Imaging studies available: yes     Required blood products, implants, devices, and special equipment available: yes     Site/side marked: yes     Immediately prior to procedure, a time out was called: yes     Patient identity confirmed:  Verbally with patient and arm band Location:    Type:  Abscess   Size:  3 cm x 3 cm   Location:  Lower  extremity   Lower extremity location:  Buttock   Buttock location:  L buttock Pre-procedure details:    Skin preparation:  Betadine and chlorhexidine with alcohol Sedation:    Sedation type:  None Anesthesia:    Anesthesia method:  Local infiltration   Local anesthetic:  Lidocaine 1% w/o epi Procedure type:    Complexity:  Simple Procedure details:    Incision types:  Single straight   Incision depth:  Subcutaneous   Wound management:  Probed and deloculated, irrigated with saline and extensive cleaning   Drainage:  Purulent and bloody   Drainage amount:  Scant   Wound treatment:  Wound left open Post-procedure details:    Procedure completion:  Tolerated well, no immediate complications     Medications Ordered in ED Medications  lidocaine (PF) (XYLOCAINE) 1 % injection 10 mL (10 mLs Infiltration Given 03/20/22 1629)  bacitracin ointment ( Topical Given 03/20/22 1738)    ED Course/ Medical Decision Making/ A&P                           Medical Decision Making Risk OTC drugs. Prescription drug management.  This patient presents to the ED for concern of abscess, this involves an extensive number of treatment options, and is a complaint that carries with it a high risk of complications and morbidity.  The differential diagnosis includes abscess, cellulitis   Co morbidities that complicate the patient evaluation   type 2 diabetes  My initial workup includes incision and drainage  Additional history obtained from: Nursing notes from this visit. Previous records within EMR system office visit on 02/17/2022 for evaluation of abscess of his right groin  I ordered, reviewed and interpreted labs which include: BMP, CBC.  CBC shows slight leukocytosis of 11.0.  BMP shows hyponatremia of 132 and hyperglycemia of 287  Afebrile, hemodynamically stable.  Patient is a 51 year old male with a history of abscesses presenting to the ED for evaluation of an abscess to the left  gluteus.  There is surrounding erythema and central fluctuance, but no obvious drainage.  It is painful when he presses on it or sits on it.  Patient has had abscesses in the past that was successfully drained and treated with doxycycline.  Since he has a history of type 2 diabetes he will be  placed on doxycycline after incision and drainage was performed today.  Patient was educated on wound management.  Strongly encouraged to monitor the area for resolution.  Advised patient to follow-up with his primary care provider in 1 week.  Gave patient return precautions.  Stable at discharge.  At this time there does not appear to be any evidence of an acute emergency medical condition and the patient appears stable for discharge with appropriate outpatient follow up. Diagnosis was discussed with patient who verbalizes understanding of care plan and is agreeable to discharge. I have discussed return precautions with patient who verbalizes understanding. Patient encouraged to follow-up with their PCP within 1 week. All questions answered.  Patient's case discussed with Dr. Vanita Panda who agrees with plan to discharge with follow-up.   Note: Portions of this report may have been transcribed using voice recognition software. Every effort was made to ensure accuracy; however, inadvertent computerized transcription errors may still be present.          Final Clinical Impression(s) / ED Diagnoses Final diagnoses:  Abscess of buttock, left    Rx / DC Orders ED Discharge Orders          Ordered    doxycycline (VIBRAMYCIN) 100 MG capsule  2 times daily        03/20/22 1731              Kelvis Berger, Grafton Folk, Hershal Coria 03/20/22 1747    Carmin Muskrat, MD 03/20/22 2110

## 2022-03-20 NOTE — ED Provider Triage Note (Signed)
Emergency Medicine Provider Triage Evaluation Note  Kirk Foley , a 51 y.o. male  was evaluated in triage.  Pt complains of abscess on the left buttocks for a week.  Patient had an abscess in the same location 2 years ago.  States the abscess has become bigger in the last 7 days, hard, tender to touch.  Denies seeing any fluid or blood drainage.  Skin is red around the abscess.  Denies any fever, rash, bowel changes, urinary symptoms.  Review of Systems  Positive: As above Negative: As above  Physical Exam  BP (!) 159/86 (BP Location: Right Arm)   Pulse (!) 112   Temp 99.7 F (37.6 C) (Oral)   Resp 16   Ht 5\' 7"  (1.702 m)   Wt 85 kg   SpO2 97%   BMI 29.35 kg/m  Gen:   Awake, no distress   Resp:  Normal effort  MSK:   Moves extremities without difficulty  Other:  5 x 5 cm fluctuant mass on left buttock  Medical Decision Making  Medically screening exam initiated at 1:25 PM.  Appropriate orders placed.  Kirk Foley was informed that the remainder of the evaluation will be completed by another provider, this initial triage assessment does not replace that evaluation, and the importance of remaining in the ED until their evaluation is complete.

## 2022-03-20 NOTE — ED Triage Notes (Signed)
C/o abscess to buttocks x1 week with redness and heat Denies drainage, fever, body aches, chills
